# Patient Record
Sex: Female | Born: 1980 | Race: White | Hispanic: No | Marital: Single
Health system: Southern US, Community
[De-identification: ages and names within clinical notes are randomized; demographics above are authoritative.]

---

## 2001-09-08 ENCOUNTER — Emergency Department (HOSPITAL_COMMUNITY): Admission: EM | Admit: 2001-09-08 | Discharge: 2001-09-08 | Payer: Self-pay | Admitting: Emergency Medicine

## 2002-01-25 ENCOUNTER — Encounter: Payer: Self-pay | Admitting: Emergency Medicine

## 2002-01-25 ENCOUNTER — Emergency Department (HOSPITAL_COMMUNITY): Admission: EM | Admit: 2002-01-25 | Discharge: 2002-01-25 | Payer: Self-pay | Admitting: Emergency Medicine

## 2002-02-04 ENCOUNTER — Encounter: Payer: Self-pay | Admitting: *Deleted

## 2002-02-04 ENCOUNTER — Emergency Department (HOSPITAL_COMMUNITY): Admission: EM | Admit: 2002-02-04 | Discharge: 2002-02-04 | Payer: Self-pay | Admitting: *Deleted

## 2004-06-02 ENCOUNTER — Emergency Department (HOSPITAL_COMMUNITY): Admission: EM | Admit: 2004-06-02 | Discharge: 2004-06-02 | Payer: Self-pay | Admitting: Emergency Medicine

## 2004-08-09 ENCOUNTER — Emergency Department (HOSPITAL_COMMUNITY): Admission: EM | Admit: 2004-08-09 | Discharge: 2004-08-09 | Payer: Self-pay | Admitting: Emergency Medicine

## 2005-03-08 ENCOUNTER — Emergency Department (HOSPITAL_COMMUNITY): Admission: EM | Admit: 2005-03-08 | Discharge: 2005-03-09 | Payer: Self-pay | Admitting: Emergency Medicine

## 2005-05-11 ENCOUNTER — Emergency Department (HOSPITAL_COMMUNITY): Admission: EM | Admit: 2005-05-11 | Discharge: 2005-05-11 | Payer: Self-pay | Admitting: Emergency Medicine

## 2005-05-14 ENCOUNTER — Emergency Department (HOSPITAL_COMMUNITY): Admission: EM | Admit: 2005-05-14 | Discharge: 2005-05-14 | Payer: Self-pay | Admitting: Emergency Medicine

## 2006-01-24 ENCOUNTER — Inpatient Hospital Stay (HOSPITAL_COMMUNITY): Admission: EM | Admit: 2006-01-24 | Discharge: 2006-01-25 | Payer: Self-pay | Admitting: Gynecology

## 2006-01-24 ENCOUNTER — Encounter: Payer: Self-pay | Admitting: Emergency Medicine

## 2006-01-27 ENCOUNTER — Inpatient Hospital Stay (HOSPITAL_COMMUNITY): Admission: AD | Admit: 2006-01-27 | Discharge: 2006-01-27 | Payer: Self-pay | Admitting: Gynecology

## 2006-03-27 ENCOUNTER — Emergency Department (HOSPITAL_COMMUNITY): Admission: EM | Admit: 2006-03-27 | Discharge: 2006-03-27 | Payer: Self-pay | Admitting: Emergency Medicine

## 2006-05-02 ENCOUNTER — Emergency Department (HOSPITAL_COMMUNITY): Admission: EM | Admit: 2006-05-02 | Discharge: 2006-05-02 | Payer: Self-pay | Admitting: Emergency Medicine

## 2006-05-23 ENCOUNTER — Emergency Department (HOSPITAL_COMMUNITY): Admission: EM | Admit: 2006-05-23 | Discharge: 2006-05-23 | Payer: Self-pay | Admitting: Emergency Medicine

## 2006-11-02 ENCOUNTER — Emergency Department (HOSPITAL_COMMUNITY): Admission: EM | Admit: 2006-11-02 | Discharge: 2006-11-02 | Payer: Self-pay | Admitting: Emergency Medicine

## 2006-11-05 ENCOUNTER — Emergency Department (HOSPITAL_COMMUNITY): Admission: EM | Admit: 2006-11-05 | Discharge: 2006-11-05 | Payer: Self-pay | Admitting: Emergency Medicine

## 2006-11-06 ENCOUNTER — Ambulatory Visit: Payer: Self-pay | Admitting: Gynecology

## 2006-11-06 ENCOUNTER — Emergency Department (HOSPITAL_COMMUNITY): Admission: EM | Admit: 2006-11-06 | Discharge: 2006-11-06 | Payer: Self-pay | Admitting: Emergency Medicine

## 2006-11-07 ENCOUNTER — Inpatient Hospital Stay (HOSPITAL_COMMUNITY): Admission: AD | Admit: 2006-11-07 | Discharge: 2006-11-07 | Payer: Self-pay | Admitting: Family Medicine

## 2006-11-07 ENCOUNTER — Emergency Department (HOSPITAL_COMMUNITY): Admission: EM | Admit: 2006-11-07 | Discharge: 2006-11-07 | Payer: Self-pay | Admitting: Emergency Medicine

## 2006-11-07 ENCOUNTER — Encounter (INDEPENDENT_AMBULATORY_CARE_PROVIDER_SITE_OTHER): Payer: Self-pay | Admitting: Gynecology

## 2006-11-07 ENCOUNTER — Ambulatory Visit: Payer: Self-pay | Admitting: Gynecology

## 2007-07-01 ENCOUNTER — Emergency Department (HOSPITAL_COMMUNITY): Admission: EM | Admit: 2007-07-01 | Discharge: 2007-07-01 | Payer: Self-pay | Admitting: Emergency Medicine

## 2007-09-08 ENCOUNTER — Emergency Department (HOSPITAL_COMMUNITY): Admission: EM | Admit: 2007-09-08 | Discharge: 2007-09-08 | Payer: Self-pay | Admitting: Emergency Medicine

## 2007-09-13 ENCOUNTER — Emergency Department (HOSPITAL_COMMUNITY): Admission: EM | Admit: 2007-09-13 | Discharge: 2007-09-13 | Payer: Self-pay | Admitting: Emergency Medicine

## 2007-09-15 ENCOUNTER — Emergency Department (HOSPITAL_COMMUNITY): Admission: EM | Admit: 2007-09-15 | Discharge: 2007-09-15 | Payer: Self-pay | Admitting: Emergency Medicine

## 2007-11-23 ENCOUNTER — Emergency Department (HOSPITAL_BASED_OUTPATIENT_CLINIC_OR_DEPARTMENT_OTHER): Admission: EM | Admit: 2007-11-23 | Discharge: 2007-11-23 | Payer: Self-pay | Admitting: Emergency Medicine

## 2007-12-14 ENCOUNTER — Emergency Department (HOSPITAL_BASED_OUTPATIENT_CLINIC_OR_DEPARTMENT_OTHER): Admission: EM | Admit: 2007-12-14 | Discharge: 2007-12-14 | Payer: Self-pay | Admitting: Emergency Medicine

## 2007-12-16 ENCOUNTER — Emergency Department (HOSPITAL_BASED_OUTPATIENT_CLINIC_OR_DEPARTMENT_OTHER): Admission: EM | Admit: 2007-12-16 | Discharge: 2007-12-16 | Payer: Self-pay | Admitting: Emergency Medicine

## 2007-12-18 ENCOUNTER — Emergency Department (HOSPITAL_BASED_OUTPATIENT_CLINIC_OR_DEPARTMENT_OTHER): Admission: EM | Admit: 2007-12-18 | Discharge: 2007-12-18 | Payer: Self-pay | Admitting: Emergency Medicine

## 2007-12-25 ENCOUNTER — Emergency Department (HOSPITAL_BASED_OUTPATIENT_CLINIC_OR_DEPARTMENT_OTHER): Admission: EM | Admit: 2007-12-25 | Discharge: 2007-12-25 | Payer: Self-pay | Admitting: Emergency Medicine

## 2008-01-12 ENCOUNTER — Emergency Department (HOSPITAL_BASED_OUTPATIENT_CLINIC_OR_DEPARTMENT_OTHER): Admission: EM | Admit: 2008-01-12 | Discharge: 2008-01-12 | Payer: Self-pay | Admitting: Emergency Medicine

## 2008-01-12 ENCOUNTER — Emergency Department (HOSPITAL_COMMUNITY): Admission: EM | Admit: 2008-01-12 | Discharge: 2008-01-13 | Payer: Self-pay | Admitting: Emergency Medicine

## 2008-01-18 ENCOUNTER — Encounter: Payer: Self-pay | Admitting: Emergency Medicine

## 2008-01-18 ENCOUNTER — Inpatient Hospital Stay (HOSPITAL_COMMUNITY): Admission: AD | Admit: 2008-01-18 | Discharge: 2008-01-22 | Payer: Self-pay | Admitting: Internal Medicine

## 2008-01-18 ENCOUNTER — Ambulatory Visit: Payer: Self-pay | Admitting: Internal Medicine

## 2008-01-20 ENCOUNTER — Ambulatory Visit: Payer: Self-pay | Admitting: Infectious Diseases

## 2008-01-24 ENCOUNTER — Inpatient Hospital Stay (HOSPITAL_COMMUNITY): Admission: EM | Admit: 2008-01-24 | Discharge: 2008-01-30 | Payer: Self-pay | Admitting: Emergency Medicine

## 2008-01-24 ENCOUNTER — Ambulatory Visit: Payer: Self-pay | Admitting: Critical Care Medicine

## 2008-01-27 ENCOUNTER — Encounter: Payer: Self-pay | Admitting: Critical Care Medicine

## 2008-02-01 ENCOUNTER — Telehealth (INDEPENDENT_AMBULATORY_CARE_PROVIDER_SITE_OTHER): Payer: Self-pay | Admitting: *Deleted

## 2008-02-04 ENCOUNTER — Telehealth: Payer: Self-pay | Admitting: Critical Care Medicine

## 2008-02-04 ENCOUNTER — Ambulatory Visit: Payer: Self-pay | Admitting: Critical Care Medicine

## 2008-02-04 DIAGNOSIS — F319 Bipolar disorder, unspecified: Secondary | ICD-10-CM | POA: Insufficient documentation

## 2008-02-04 DIAGNOSIS — J189 Pneumonia, unspecified organism: Secondary | ICD-10-CM | POA: Insufficient documentation

## 2008-02-04 DIAGNOSIS — J45909 Unspecified asthma, uncomplicated: Secondary | ICD-10-CM | POA: Insufficient documentation

## 2008-02-04 DIAGNOSIS — F17200 Nicotine dependence, unspecified, uncomplicated: Secondary | ICD-10-CM | POA: Insufficient documentation

## 2008-02-04 DIAGNOSIS — E669 Obesity, unspecified: Secondary | ICD-10-CM | POA: Insufficient documentation

## 2008-02-05 ENCOUNTER — Emergency Department (HOSPITAL_BASED_OUTPATIENT_CLINIC_OR_DEPARTMENT_OTHER): Admission: EM | Admit: 2008-02-05 | Discharge: 2008-02-05 | Payer: Self-pay | Admitting: Emergency Medicine

## 2008-02-06 ENCOUNTER — Emergency Department (HOSPITAL_BASED_OUTPATIENT_CLINIC_OR_DEPARTMENT_OTHER): Admission: EM | Admit: 2008-02-06 | Discharge: 2008-02-06 | Payer: Self-pay | Admitting: Emergency Medicine

## 2008-02-06 ENCOUNTER — Encounter: Payer: Self-pay | Admitting: Critical Care Medicine

## 2008-02-07 ENCOUNTER — Emergency Department (HOSPITAL_BASED_OUTPATIENT_CLINIC_OR_DEPARTMENT_OTHER): Admission: EM | Admit: 2008-02-07 | Discharge: 2008-02-07 | Payer: Self-pay | Admitting: Emergency Medicine

## 2008-02-08 ENCOUNTER — Telehealth (INDEPENDENT_AMBULATORY_CARE_PROVIDER_SITE_OTHER): Payer: Self-pay | Admitting: *Deleted

## 2008-02-09 ENCOUNTER — Emergency Department (HOSPITAL_COMMUNITY): Admission: EM | Admit: 2008-02-09 | Discharge: 2008-02-09 | Payer: Self-pay | Admitting: Emergency Medicine

## 2008-02-09 ENCOUNTER — Ambulatory Visit: Payer: Self-pay | Admitting: Internal Medicine

## 2008-02-10 ENCOUNTER — Emergency Department (HOSPITAL_BASED_OUTPATIENT_CLINIC_OR_DEPARTMENT_OTHER): Admission: EM | Admit: 2008-02-10 | Discharge: 2008-02-10 | Payer: Self-pay | Admitting: Emergency Medicine

## 2008-02-18 ENCOUNTER — Emergency Department (HOSPITAL_COMMUNITY): Admission: EM | Admit: 2008-02-18 | Discharge: 2008-02-18 | Payer: Self-pay | Admitting: Emergency Medicine

## 2008-02-21 ENCOUNTER — Emergency Department (HOSPITAL_COMMUNITY): Admission: EM | Admit: 2008-02-21 | Discharge: 2008-02-21 | Payer: Self-pay | Admitting: Emergency Medicine

## 2008-02-21 ENCOUNTER — Inpatient Hospital Stay (HOSPITAL_COMMUNITY): Admission: AD | Admit: 2008-02-21 | Discharge: 2008-02-21 | Payer: Self-pay | Admitting: Obstetrics and Gynecology

## 2008-02-25 ENCOUNTER — Emergency Department (HOSPITAL_BASED_OUTPATIENT_CLINIC_OR_DEPARTMENT_OTHER): Admission: EM | Admit: 2008-02-25 | Discharge: 2008-02-25 | Payer: Self-pay | Admitting: Emergency Medicine

## 2008-03-16 ENCOUNTER — Ambulatory Visit: Payer: Self-pay | Admitting: Diagnostic Radiology

## 2008-03-16 ENCOUNTER — Emergency Department (HOSPITAL_BASED_OUTPATIENT_CLINIC_OR_DEPARTMENT_OTHER): Admission: EM | Admit: 2008-03-16 | Discharge: 2008-03-16 | Payer: Self-pay | Admitting: Emergency Medicine

## 2008-03-18 ENCOUNTER — Emergency Department: Payer: Self-pay | Admitting: Emergency Medicine

## 2008-03-23 ENCOUNTER — Emergency Department (HOSPITAL_BASED_OUTPATIENT_CLINIC_OR_DEPARTMENT_OTHER): Admission: EM | Admit: 2008-03-23 | Discharge: 2008-03-23 | Payer: Self-pay | Admitting: Emergency Medicine

## 2008-04-12 ENCOUNTER — Emergency Department (HOSPITAL_COMMUNITY): Admission: EM | Admit: 2008-04-12 | Discharge: 2008-04-12 | Payer: Self-pay | Admitting: Emergency Medicine

## 2008-04-12 ENCOUNTER — Emergency Department: Payer: Self-pay | Admitting: Internal Medicine

## 2008-04-25 ENCOUNTER — Emergency Department (HOSPITAL_COMMUNITY): Admission: EM | Admit: 2008-04-25 | Discharge: 2008-04-25 | Payer: Self-pay | Admitting: Emergency Medicine

## 2008-04-28 ENCOUNTER — Emergency Department (HOSPITAL_BASED_OUTPATIENT_CLINIC_OR_DEPARTMENT_OTHER): Admission: EM | Admit: 2008-04-28 | Discharge: 2008-04-28 | Payer: Self-pay | Admitting: Emergency Medicine

## 2008-10-24 ENCOUNTER — Ambulatory Visit: Payer: Self-pay | Admitting: Occupational Medicine

## 2008-12-04 ENCOUNTER — Emergency Department (HOSPITAL_COMMUNITY): Admission: EM | Admit: 2008-12-04 | Discharge: 2008-12-04 | Payer: Self-pay | Admitting: Emergency Medicine

## 2008-12-17 ENCOUNTER — Emergency Department (HOSPITAL_BASED_OUTPATIENT_CLINIC_OR_DEPARTMENT_OTHER): Admission: EM | Admit: 2008-12-17 | Discharge: 2008-12-17 | Payer: Self-pay | Admitting: Internal Medicine

## 2009-05-01 ENCOUNTER — Emergency Department (HOSPITAL_BASED_OUTPATIENT_CLINIC_OR_DEPARTMENT_OTHER): Admission: EM | Admit: 2009-05-01 | Discharge: 2009-05-01 | Payer: Self-pay | Admitting: Emergency Medicine

## 2010-01-09 ENCOUNTER — Emergency Department (HOSPITAL_COMMUNITY): Admission: EM | Admit: 2010-01-09 | Discharge: 2010-01-09 | Payer: Self-pay | Admitting: Emergency Medicine

## 2010-06-27 IMAGING — CT CT ABDOMEN W/O CM
2 of 3 series · 17 of 46 positions shown, 19 images · non-contrast
Comparison: 12/14/2007

CT ABDOMEN

CLINICAL DATA: right lower quadrant pain

CT ABDOMEN AND PELVIS WITHOUT CONTRAST
TECHNIQUE: Multidetector CT imaging of the abdomen and pelvis was
performed following the standard
protocol without intravenous contrast.

[Series 2: renal stone > 200 lbs 5.0 b31f · axial · 0.85mm/px · z∈[-458,-68]mm · 14 of 90 slices shown, 16 images]
[im 6/90  soft-tissue]
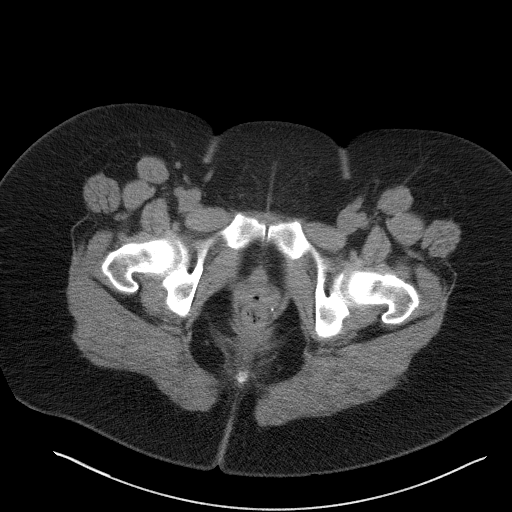
[im 6/90  bone]
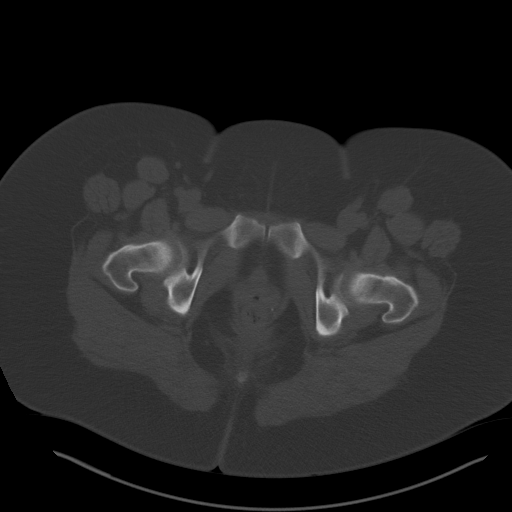
[im 12/90  soft-tissue]
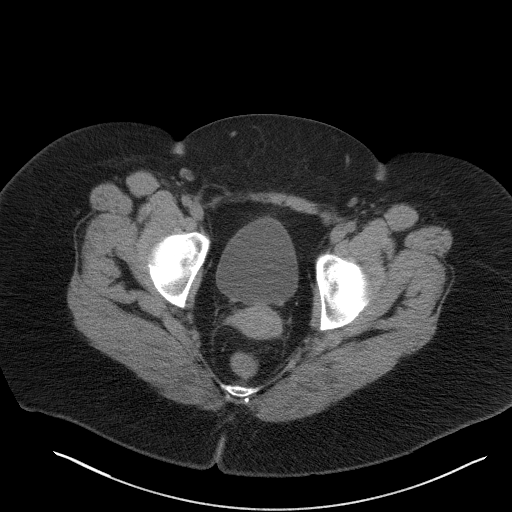
[im 18/90  soft-tissue]
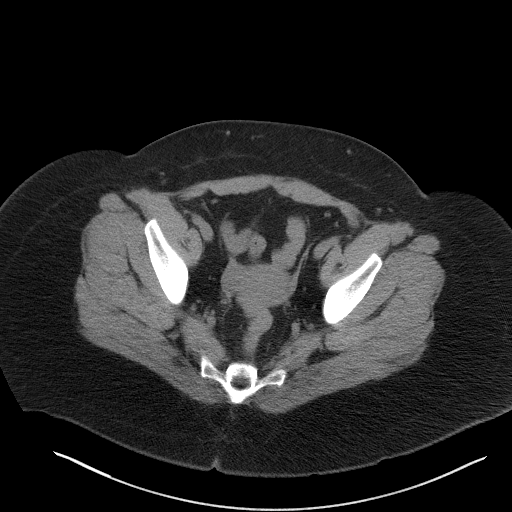
[im 23/90  soft-tissue]
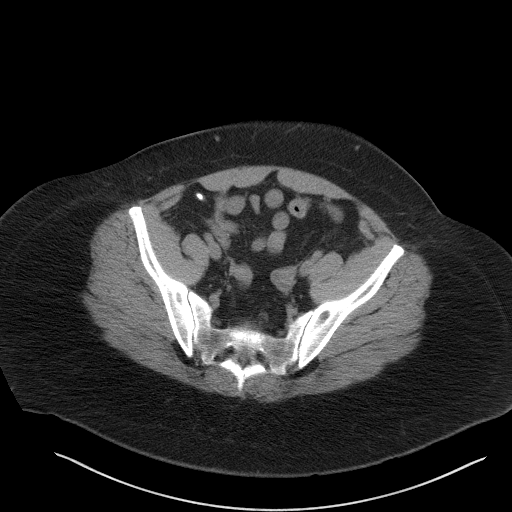
[im 29/90  soft-tissue]
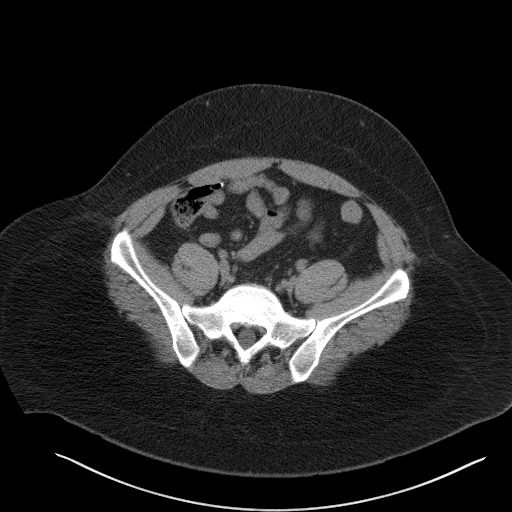
[im 35/90  soft-tissue]
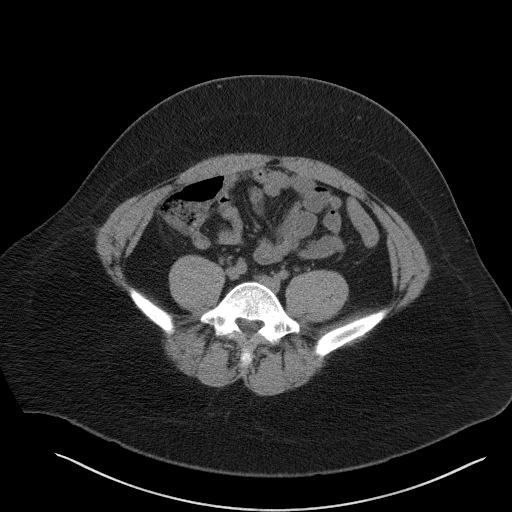
[im 41/90  soft-tissue]
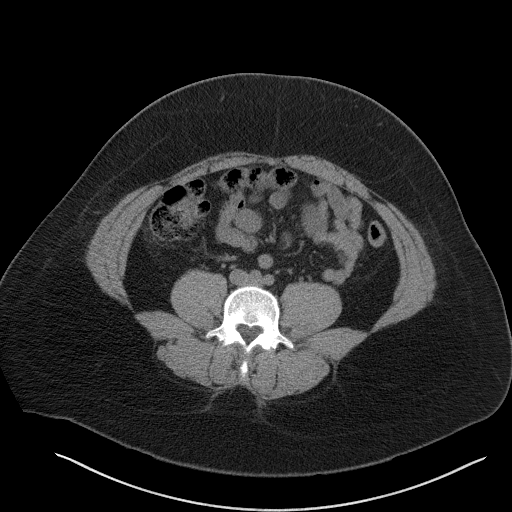
[im 49/90  soft-tissue]
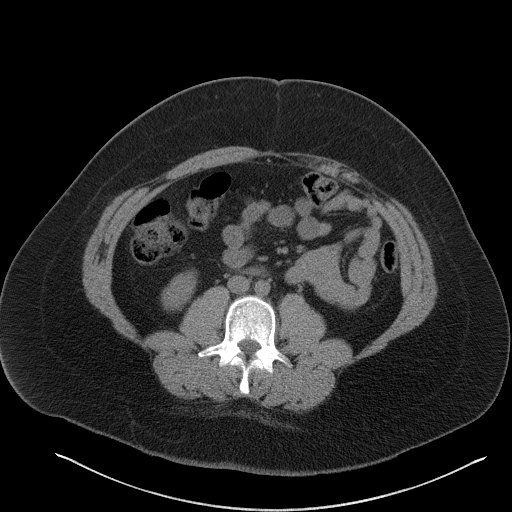
[im 55/90  soft-tissue]
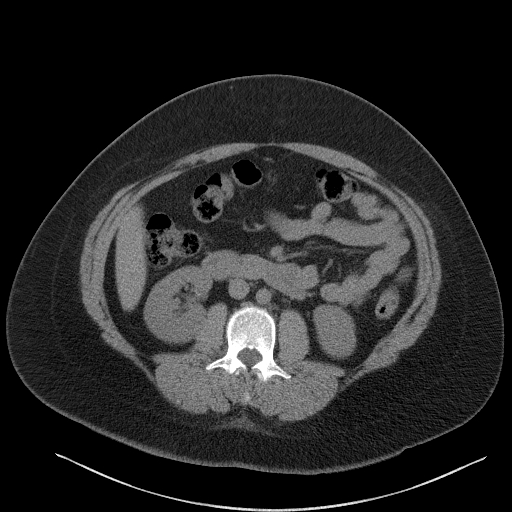
[im 55/90  bone]
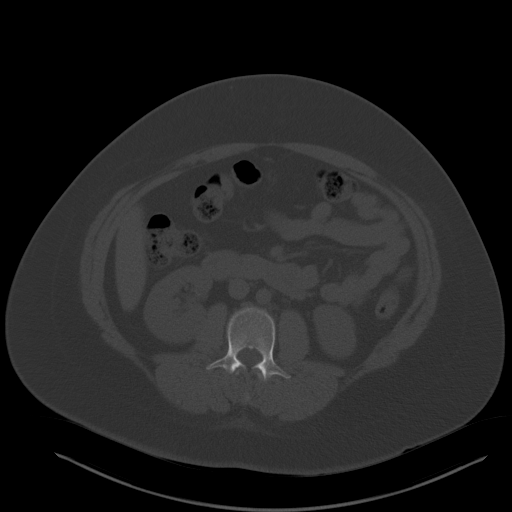
[im 61/90  soft-tissue]
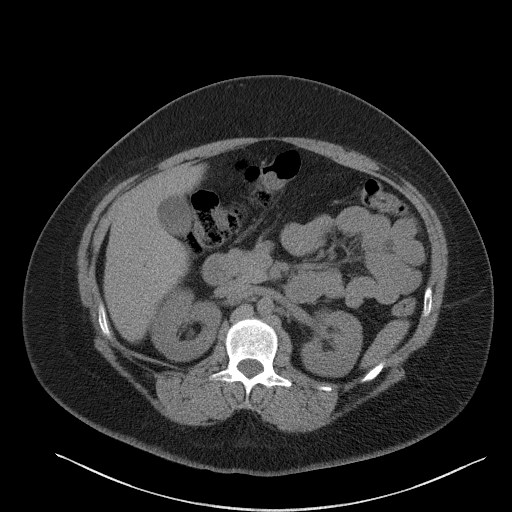
[im 67/90  soft-tissue]
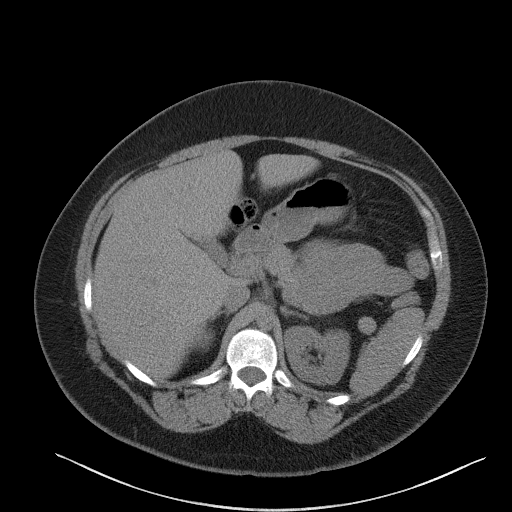
[im 72/90  soft-tissue]
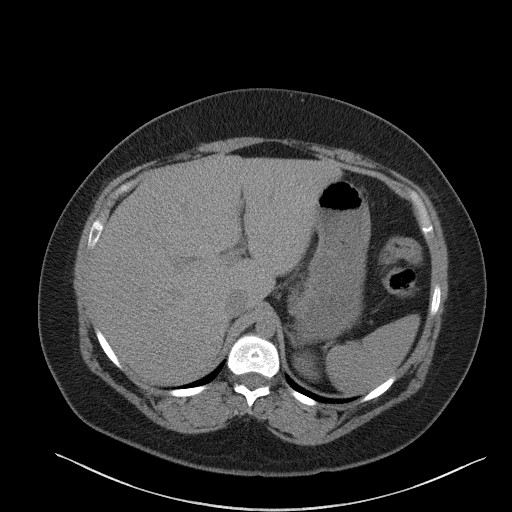
[im 78/90  soft-tissue]
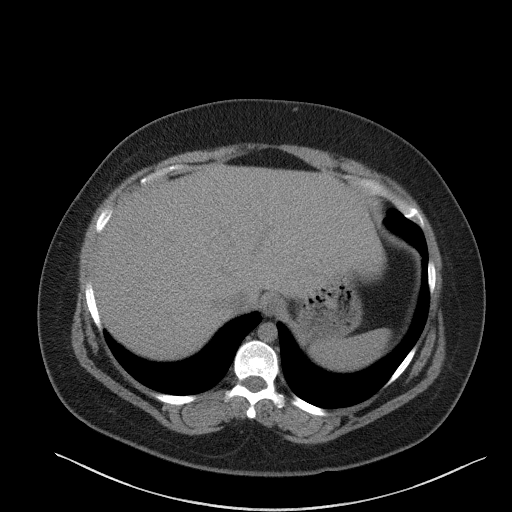
[im 84/90  soft-tissue]
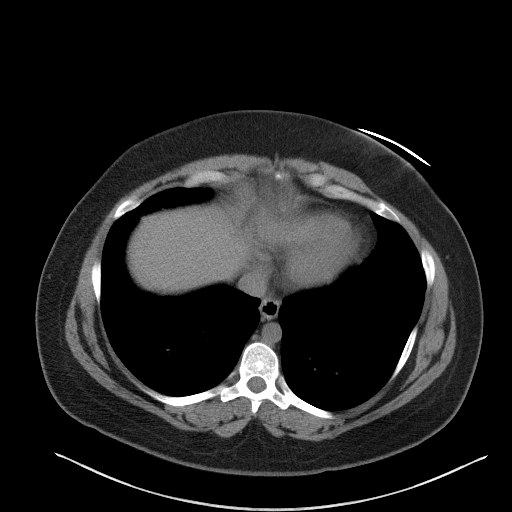

[Series 3: renal stone 2.0 coronal · coronal · 0.84mm/px · 3 of 158 slices shown]
[im 53/158  soft-tissue]
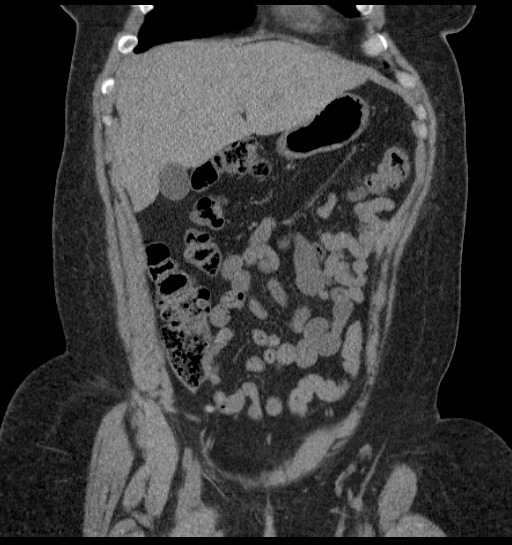
[im 70/158  soft-tissue]
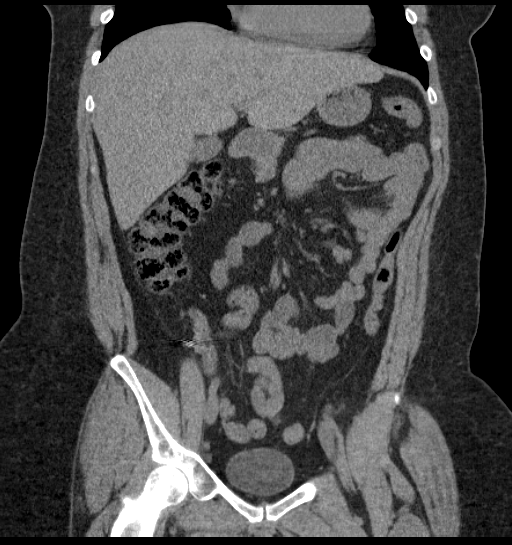
[im 88/158  soft-tissue]
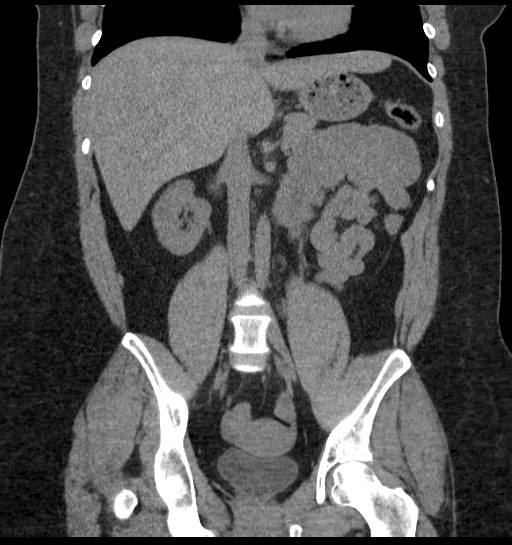

[17 of 46 positions shown; findings below may reference images not displayed]

FINDINGS: No renal or proximal ureteral stones.  No
hydronephrosis.  Solid organs have an unremarkable unenhanced
appearance.  No free fluid, free air, or adenopathy.  Bowel and
gallbladder grossly unremarkable.

Lung bases are clear.  No effusions.  Heart is normal size.
IMPRESSION: No acute findings in the abdomen on this unenhanced study.

CT PELVIS
FINDINGS: Appendix is normal, partially filled with oral contrast
material likely from recent contrasted CT.  Rounded metallic
structure noted within the cecum of unknown etiology.  This was not
present on prior study, and may be related to ingested material.
Scattered sigmoid diverticula.  No evidence of active
diverticulitis.  No free fluid, free air, or adenopathy.  Pelvic
small bowel grossly unremarkable without contrast.

No acute bony abnormality.
IMPRESSION: No significant acute finding or inflammatory process in the pelvis.

## 2010-06-28 LAB — DIFFERENTIAL
Basophils Absolute: 0 10*3/uL (ref 0.0–0.1)
Lymphocytes Relative: 14 % (ref 12–46)
Lymphs Abs: 2.1 10*3/uL (ref 0.7–4.0)
Neutro Abs: 12.1 10*3/uL — ABNORMAL HIGH (ref 1.7–7.7)
Neutrophils Relative %: 79 % — ABNORMAL HIGH (ref 43–77)

## 2010-06-28 LAB — CBC
Platelets: 296 10*3/uL (ref 150–400)
RBC: 4.23 MIL/uL (ref 3.87–5.11)
RDW: 14.2 % (ref 11.5–15.5)
WBC: 15.4 10*3/uL — ABNORMAL HIGH (ref 4.0–10.5)

## 2010-06-28 LAB — BASIC METABOLIC PANEL
Calcium: 9.1 mg/dL (ref 8.4–10.5)
Chloride: 105 mEq/L (ref 96–112)
Creatinine, Ser: 0.84 mg/dL (ref 0.4–1.2)
GFR calc Af Amer: >60 mL/min (ref >60)
GFR calc non Af Amer: >60 mL/min (ref >60)

## 2010-07-01 LAB — COMPREHENSIVE METABOLIC PANEL
ALT: 30 U/L (ref 0–35)
AST: 22 U/L (ref 0–37)
Albumin: 4.4 g/dL (ref 3.5–5.2)
CO2: 25 mEq/L (ref 19–32)
Calcium: 9 mg/dL (ref 8.4–10.5)
Chloride: 105 mEq/L (ref 96–112)
GFR calc Af Amer: >60 mL/min (ref >60)
GFR calc non Af Amer: >60 mL/min (ref >60)
Sodium: 138 mEq/L (ref 135–145)
Total Bilirubin: 0.4 mg/dL (ref 0.3–1.2)

## 2010-07-21 LAB — URINALYSIS, ROUTINE W REFLEX MICROSCOPIC
Bilirubin Urine: NEGATIVE
Hgb urine dipstick: NEGATIVE
Ketones, ur: NEGATIVE mg/dL
Nitrite: NEGATIVE
Specific Gravity, Urine: 1.029 (ref 1.005–1.030)
pH: 6 (ref 5.0–8.0)

## 2010-07-21 LAB — COMPREHENSIVE METABOLIC PANEL
Alkaline Phosphatase: 56 U/L (ref 39–117)
BUN: 13 mg/dL (ref 6–23)
CO2: 23 mEq/L (ref 19–32)
Chloride: 108 mEq/L (ref 96–112)
Creatinine, Ser: 1.04 mg/dL (ref 0.4–1.2)
GFR calc non Af Amer: >60 mL/min (ref >60)
Glucose, Bld: 95 mg/dL (ref 70–99)
Potassium: 3.6 mEq/L (ref 3.5–5.1)
Total Bilirubin: 0.6 mg/dL (ref 0.3–1.2)

## 2010-07-21 LAB — AMMONIA: Ammonia: 24 umol/L (ref 11–35)

## 2010-07-21 LAB — CBC
HCT: 37 % (ref 36.0–46.0)
Hemoglobin: 12.7 g/dL (ref 12.0–15.0)
MCV: 92.2 fL (ref 78.0–100.0)
Platelets: 310 10*3/uL (ref 150–400)
WBC: 11.6 10*3/uL — ABNORMAL HIGH (ref 4.0–10.5)

## 2010-07-21 LAB — DIFFERENTIAL
Basophils Absolute: 0.1 10*3/uL (ref 0.0–0.1)
Eosinophils Absolute: 0.3 10*3/uL (ref 0.0–0.7)
Eosinophils Relative: 3 % (ref 0–5)
Lymphocytes Relative: 26 % (ref 12–46)
Monocytes Absolute: 0.7 10*3/uL (ref 0.1–1.0)

## 2010-07-21 LAB — ETHANOL: Alcohol, Ethyl (B): <5 mg/dL (ref 0–10)

## 2010-07-21 LAB — PROTIME-INR: Prothrombin Time: 12.6 seconds (ref 11.6–15.2)

## 2010-07-21 LAB — LITHIUM LEVEL: Lithium Lvl: 0.59 mEq/L — ABNORMAL LOW (ref 0.80–1.40)

## 2010-07-30 LAB — URINALYSIS, ROUTINE W REFLEX MICROSCOPIC
Bilirubin Urine: NEGATIVE
Hgb urine dipstick: NEGATIVE
Nitrite: NEGATIVE
Specific Gravity, Urine: 1.008 (ref 1.005–1.030)
Urobilinogen, UA: 0.2 mg/dL (ref 0.0–1.0)
pH: 7.5 (ref 5.0–8.0)

## 2010-07-30 LAB — COMPREHENSIVE METABOLIC PANEL WITH GFR
ALT: 13 U/L (ref 0–35)
AST: 13 U/L (ref 0–37)
Albumin: 4.2 g/dL (ref 3.5–5.2)
Alkaline Phosphatase: 70 U/L (ref 39–117)
BUN: 8 mg/dL (ref 6–23)
CO2: 29 meq/L (ref 19–32)
Calcium: 9.8 mg/dL (ref 8.4–10.5)
Chloride: 102 meq/L (ref 96–112)
Creatinine, Ser: 0.97 mg/dL (ref 0.4–1.2)
GFR calc non Af Amer: >60 mL/min
Glucose, Bld: 106 mg/dL — ABNORMAL HIGH (ref 70–99)
Potassium: 4.3 meq/L (ref 3.5–5.1)
Sodium: 138 meq/L (ref 135–145)
Total Bilirubin: 0.6 mg/dL (ref 0.3–1.2)
Total Protein: 6.6 g/dL (ref 6.0–8.3)

## 2010-07-30 LAB — CBC
Hemoglobin: 13.7 g/dL (ref 12.0–15.0)
RBC: 4.25 MIL/uL (ref 3.87–5.11)
WBC: 6.5 10*3/uL (ref 4.0–10.5)

## 2010-07-30 LAB — DIFFERENTIAL
Basophils Absolute: 0 K/uL (ref 0.0–0.1)
Basophils Relative: 1 % (ref 0–1)
Eosinophils Absolute: 0.2 K/uL (ref 0.0–0.7)
Eosinophils Relative: 4 % (ref 0–5)
Lymphocytes Relative: 21 % (ref 12–46)
Lymphs Abs: 1.4 K/uL (ref 0.7–4.0)
Monocytes Absolute: 0.5 K/uL (ref 0.1–1.0)
Monocytes Relative: 8 % (ref 3–12)
Neutro Abs: 4.3 K/uL (ref 1.7–7.7)
Neutrophils Relative %: 67 % (ref 43–77)

## 2010-07-30 LAB — LIPASE, BLOOD: Lipase: 15 U/L (ref 11–59)

## 2010-08-28 NOTE — Discharge Summary (Signed)
NAMESHAMERE, DILWORTH       ACCOUNT NO.:  [id number]   MEDICAL RECORD NO.:  [id number]          PATIENT TYPE:  INP   LOCATION:  1441                         FACILITY:  [facility]   PHYSICIAN:  Beckey Rutter, MD  DATE OF BIRTH:  1980/06/01   DATE OF ADMISSION:  01/23/2008  DATE OF DISCHARGE:  01/30/2008                               DISCHARGE SUMMARY   PRIMARY CARE PHYSICIAN:  Unassigned.   HOSPITAL COURSE:  Shortness of breath with bilateral infiltrates.  Atypical pneumonia.  The patient was seen by pulmonary services for  consultation and underwent fiberoptic bronchoscopy.  The patient was  continued on antibiotic during the hospital course and on steroid,  antibiotic Avelox and on steroid as well.  The patient currently stable  for discharge.  She was advised to follow up with Dr. Shan Levans for  the result of the BAL.   HOSPITAL CONSULTATION:  Pulmonary consultation provided kindly by Dr.  Delford Field.   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  Recommendation is to  follow up pathology and microbiology.  Hospital imaging included an x-  ray done on January 24, 2008, showing improving bilateral  bronchioalveolar ground glass likely representing improving pneumonia.  Lab test today showing respiratory culture is negative up-to-date.  Blood culture is negative up-to-date.  Fungal antibodies negative.  Legionella profile is negative.   DISCHARGE DIAGNOSES:  1. Bilateral pneumonia/bronchoalveolitis after the FOB (fiberoptic      bronchoscopy).  2. History of bronchial asthma.  3. Bipolar disorder.  4. Obesity.  5. Tobacco abuse.   DISCHARGE MEDICATIONS:  1. Nicotine patch 21 mg p.o. daily.  2. Doxycycline 100 mg p.o. for 7 days.  3. Prednisone tapering dose start with 30 mg for 4 days, 20 mg for 4      days, 10 mg for 4 days and then stop.  4. Vicodin 5/325 p.o. q.6 hours p.r.n.  A  prescription was written for      15.  5. Lithium 300 mg p.o. daily.  6. Lithium 600 mg at bedtime.  7. Geodon 120 mg twice a day.  8. Prozac 20 mg 2 tablets daily.  9. Klonopin 1 mg at bedtime.  10.Trazodone 100 mg at bedtime.  11.Prilosec 40 mg.  12.Vicodin 5/325 mg 2 tablets p.o. q.6 hours p.r.n. #15.   DISCHARGE PLAN:  The patient is stable for discharge today.  She will  follow up with [facility] in 1-2 weeks.  The patient is aware and  agreeable to the discharge plan.  Notice, prescription for Vicodin 5/325  mg 2 tablets p.o. q.6 hours p.r.n. was prescribed for #15.      Beckey Rutter, MD  Electronically Signed     EME/MEDQ  D:  01/30/2008  T:  01/30/2008  Job:  [id number]

## 2010-08-28 NOTE — Consult Note (Signed)
NAMEVICKEE, MORMINO       ACCOUNT NO.:  [id number]   MEDICAL RECORD NO.:  [id number]          PATIENT TYPE:  INP   LOCATION:  1441                         FACILITY:  [facility]   PHYSICIAN:  Charlcie Cradle. Delford Field, MD, FCCPDATE OF BIRTH:  01-[birth year]-80   DATE OF CONSULTATION:  01/26/2008  DATE OF DISCHARGE:                                 CONSULTATION   INDICATIONS:  Bilateral infiltrates, bronchiectasis, bronchitis.  Evaluate for pneumonia.  This is a 30 year old white female who has had  a chronic cough for 2 months, was admitted to [facility] between October  5 and October 9 and discharged.  At that point she had shortness of  breath, nausea, vomiting.  These symptoms have now continued.  She was  tested H1N1 negative.  She has history of bipolar disorder, asthma,  restless leg, endometriosis, polycystic ovary disease and obesity.  She  has had bilateral patchy ground-glass changes on CT scan.  She was  recommended take Avelox and get CT scan follow-up as an outpatient.  No  bronchoscopy was performed.  The patient continues to have a symptom  complex that is persisting and she is referred now for further  evaluation.  She was readmitted to [facility] and on this  basis repeat evaluation is requested.  The system does show a CT scan of  the chest in February 2008 showing a mosaic attenuation lung pattern  that could be compatible with infiltrative lung disease so this is a  chronic process and not an acute process that we are discerning now.  Current CT scan is reading minimal mediastinal adenopathy, bilateral  ground-glass changes that appear to be peribronchovascular in  distribution, slightly improved from January 18, 2008.  The upper abdomen  shows no stones and no worrisome lytic or sclerotic lesions.  There had  been a preexisting scan done of the chest on January 18, 2008 and within  6 days it was repeated showing some improvement.  Based on this, a  bronchoscopy was not  elected.  The patient was sent home.  The patient  now returns for further evaluation and treatments.   PAST MEDICAL HISTORY:  As noted above.   SURGICAL HISTORY:  None.   MEDICATIONS ON ADMISSION:  1. Avelox.  2. Lithium.  3. Geodon.  4. Prozac.  5. Klonopin.  6. Trazodone.  7. Tylenol.   ALLERGIES:  DEMEROL, TORADOL.   SOCIAL HISTORY:  Unemployed, smokes a pack and a half a day, is a  nondrinker, no illicit drug use.   FAMILY HISTORY:  Noncontributory except for bipolar disorder.   PHYSICAL EXAM:  VITAL SIGNS:  Temperature 98, blood pressure 126/70,  pulse 120, saturations 96% on 2 liters, respirations in the 20s. CHEST:  Showed prominence pseudowheeze, but no peripheral wheezing, rales or  rhonchi.  CARDIAC:  Exam showed a regular, rate and rhythm without S3, normal S1,  S2.  ABDOMEN:  Soft, nontender.  EXTREMITIES:  No edema, clubbing or venous disease.  SKIN:  Clear.  NEUROLOGIC:  Exam was intact.  HEENT:  Exam showed no jugular venous distension, lymphadenopathy.  Oropharynx clear.  NECK:  Supple.  CT scan chest was reported and is noted as above.   OTHER LAB DATA:  C4, C3 levels were normal.  ANA is negative.  Sodium  141, potassium 3.6, chloride 110, CO2 24, BUN 8, creatinine 0.9.  White  count 13,000, hemoglobin 11.3.  HIV was nonreactive.  TSH 9.5284.  C-  reactive protein normal.  Blood cultures are negative.  Aspergillus  antibody is negative.  Sed rate was slightly elevated at 32.  Legionella  urine antigen was negative.  H1N1, PCR was negative.  Rheumatoid factor  was less than 20.  D-dimer was less than 0.22.   IMPRESSION:  Bilateral chronic mosaic pattern of ground glass changes,  rule out chronic alveolitis, rule out bronchiolitis obliterans,  organized pneumonia, rule out interstitial disease.  Also rule out vocal  cord dysfunction syndrome.   RECOMMENDATIONS:  Pursue bronchoscopy.  Once results of this are  available, further recommendations  would follow.      Charlcie Cradle Delford Field, MD, [facility]  Electronically Signed     PEW/MEDQ  D:  01/26/2008  T:  01/27/2008  Job:  [id number]   cc:   [facility]   ? [place]  Marcy [place], [place]

## 2010-08-28 NOTE — Discharge Summary (Signed)
[name], [name]       ACCOUNT NO.:  [id number]   MEDICAL RECORD NO.:  [id number]          PATIENT TYPE:  INP   LOCATION:  2041                         FACILITY:  [facility]   PHYSICIAN:  [name], MDDATE OF BIRTH:  01/09/1981   DATE OF ADMISSION:  01/18/2008  DATE OF DISCHARGE:  01/22/2008                               DISCHARGE SUMMARY   COURSE IN THE HOSPITAL:  A 30 year old female with a history of bipolar  disorder presented with flu-like symptoms and shortness of breath along  with some chest pain with nonproductive cough.  The patient on admission  had a CAT scan of the chest which showed features of bibasilar ground-  glass opacities compatible with pneumonitis or vasculitis.  The patient  admitted to the medical floor and was started on empiric antibiotics and  Infectious Disease consult was obtained.  As per Infectious Disease, a  Pulmonary consult was recommended and Pulmonology was consulted.  At  this time, Pulmonary feels that the patient's  symptoms have been  improving, to be discharged home on antireflux medications, continue  antibiotics, and will need a repeat CT of the chest in 3-4 weeks and if  at that time, the patient has persistent infiltrates, will need VATS or  bronchoscopy and per Infectious Disease, the patient's symptoms have  largely improved to be discharged on Avelox for 3 more days.  I did  advice the patient that not to take antibiotics, if planning pregnancy  and the patient also was advised that she will need a definite followup  with her primary care physician who she says she follows with Dr.  Luvenia White in [place] at [facility] and she has confirmed  that she will definitely follow with doctor within a weeks' time.   PROCEDURES DONE DURING THIS STAY:  CT angio of the chest showed negative  for pulmonary embolism, ground-glass opacities in the lungs bilaterally  have progressed slightly in the interval.  It is  likely due to  progressive pneumonitis or vasculitis.  Chest x-ray done showed patchy  atelectasis and infiltrate without consolidation is evident in the right  lung base, in the left perihilar region.  The infiltrated densities are  compatible with ground-glass infiltrates seen on the CT exam.   FINAL DIAGNOSES:  1. Pneumonitis.  2. Bipolar disorder.  3. History of cigarette smoking.   DISCHARGE MEDICATIONS:  1. Avelox 400 mg p.o. daily for 3 more days.  2. Lithium 300 mg p.o. daily in morning and 600 mg p.o. at bedtime.  3. Geodon 120 mg p.o. twice daily.  4. Prozac 20 mg 2 tablets p.o. daily.  5. Imodium as needed p.r.n.  6. Klonopin 1 mg p.o. twice daily.  7. Trazodone 100 mg p.o. at bedtime.  8. Percocet 5/325 mg p.o. every 12 hours p.r.n.  9. Prilosec 40 mg p.o. daily.   PLAN:  The patient advised to follow up with the primary care physician  within a weeks' time to repeat CT chest within 3-4 weeks.  If there is  persistent infiltrate, the patient will need at that time VATS or  bronchoscopy.  The patient strongly advised  to quit smoking.      Betty Clos, MD  Electronically Signed     ANK/MEDQ  D:  01/22/2008  T:  01/23/2008  Job:  [id number]

## 2010-08-28 NOTE — Op Note (Signed)
[name], [name]       ACCOUNT NO.:  [id number]   MEDICAL RECORD NO.:  [id number]          PATIENT TYPE:  INP   LOCATION:  1441                         [facility]ACILITY:  [facility]   PHYSICIAN:  Charlcie Cradle. Delford Field, MD, FCCPDATE OF BIRTH:  [birth year]79   DATE OF PROCEDURE:  01/27/2008  DATE OF DISCHARGE:                               OPERATIVE REPORT   INDICATIONS FOR PROCEDURE:  Bilateral infiltrates, evaluate for cause.   SURGEON:  Charlcie Cradle. Delford Field, MD, FCCP.   ANESTHESIA:  1% Xylocaine local.   PREMEDICATION:  Fentanyl 30 mcg, Versed 5 mg IV push.   PROCEDURE:  The Pentax video bronchoscope introduced through the left  naris.  The upper airways were visualized and were unremarkable.  The  entire tracheobronchial tree was visualized and revealed diffuse  tracheobronchitis but no endobronchial lesions were seen.  Attention was  then paid to the right lower lobe.  Transbronchial biopsies x5 were  obtained.  Bronchial washings were obtained.   COMPLICATIONS:  None.   IMPRESSION:  Tracheobronchitis with vocal cord dysfunction syndrome,  evaluate for cause.   RECOMMENDATIONS:  Follow up pathology and microbiology.      Charlcie Cradle Delford Field, MD, [facility]  Electronically Signed     PEW/MEDQ  D:  01/27/2008  T:  01/27/2008  Job:  [id number]

## 2010-08-28 NOTE — H&P (Signed)
[name], [name] NO.:  [id number]   MEDICAL RECORD NO.:  [id number]          PATIENT TYPE:  INP   LOCATION:  2105                         FACILITY:  [facility]   PHYSICIAN:  Vania Rea, M.D. DATE OF BIRTH:  [birth year]   DATE OF ADMISSION:  01/18/2008  DATE OF DISCHARGE:                              HISTORY & PHYSICAL   PRIMARY CARE PHYSICIAN:  Dr. Luvenia Starch in [place] at the [facility].   PSYCHIATRIST:  Dr. Charletta Cousin at the [facility].   CHIEF COMPLAINT:  Flu-like symptoms for 35-month.   HISTORY OF PRESENT ILLNESS:  This is a 30 year old somewhat obese  Caucasian lady with a history of bipolar disorder, who reports symptoms  which she had described as a flu-like illness for the past 65-month.  The  patient describes lethargy, anorexia, fever, nausea, vomiting, diarrhea,  abdominal pain and sore throat persisting for 32-month.  Because of the  persistent nonproductive cough, the patient says she now has severe  chest pains with coughing.  The patient says she has a thermometer at  home, takes her temperature every day and it is always exactly 101.2.  Initially when going through her medical record, the patient denied ever  using Tylenol; however, when asked if she takes Tylenol for the fever,  she says she takes Tylenol four times daily.  The patient says she  vomits about four times per day and it is yellow and bitter.  She has  about four mucusy stools every day, not very much, but the abdominal  pain appears to be independent of the stool frequency and is colicky.  She also notes a throbbing headache at the top of her head.  She  describes also urinary frequency without dysuria.   The patient is in fact a transfer from the [facility]  emergency room.  The accompanying documentation indicates that the  patient has been visiting for the past 4 days, with a history of flu-  like symptoms, and she was eventually  transferred for follow-up.  A  chest x-ray done showed no acute infiltrates, but did show some  increased interstitial markings.  Her D-dimer was undetectable,  nevertheless, a CT angiogram was done and this showed no acute  infiltrates, but did show what was described as increased in bibasilar  ground glass opacities, compatible with pneumonitis or vasculitis.   The patient denies any sick contacts.  Her boyfriend who she lives with  has no flu-like symptoms, no nausea, vomiting or diarrhea.  She is  currently unemployed.  Patient denies alcohol use or illicit drug use,  but does smoke half a pack of cigarettes per day.   PAST MEDICAL HISTORY:  1. Bipolar disorder.  2. Polycystic ovarian endometriosis.   MEDICATIONS:  1. Lithium 300 mg each morning and 600 mg at bedtime.  2. Geodon 120 mg twice daily.  3. Prozac 40 mg daily.  4. Klonopin 1 mg twice daily.  5. Trazodone 100 mg at bedtime.  6. Tylenol p.r.n.  7. Imodium p.r.n.   ALLERGIES:  TORADOL, DEMEROL AND OTHER NSAIDS ALL CAUSE HIVES.   SOCIAL HISTORY:  Significant  for half-a-pack per day of tobacco use.  Denies any other as noted above.   FAMILY HISTORY:  Significant for mother with bipolar disorder, otherwise  unremarkable.   REVIEW OF SYSTEMS:  Other than noted above, significant only for  persistent sweating and a feeling of being hot all the time.  She is now  on Depo-Provera monthly, which makes her periods regular, last menstrual  period, December 15, 2007, for 3 days.  She no longer has dysmenorrhea.  The patient complains of a rash on upper abdomen for the duration of her  sickness.   PHYSICAL EXAMINATION:  GENERAL:  Obese young Caucasian lady lying flat  on the stretcher, breathing rapidly, but keeps her mouth closed.  VITAL SIGNS:  Temperature is 98.4, pulse 120, respiration 24, blood  pressure 115/60.  Saturating at 97% on 2 liters.  The records from [facility] facility shows that over the past 8 hours,  her highest temperature  has been 98.5.  Her blood pressures have been normal.  Her respiratory  rate has been elevated as high as 32.  Her pulse was been elevated with  a high of 24.  Her oxygen saturation was 96% on room air.  HEENT:  Her pupils are round, equal and reactive.  Her mucous membranes  are pink and anicteric.  She is mildly dehydrated.  NECK:  She has no cervical lymphadenopathy or thyromegaly.  She does  have a thick neck.  CHEST:  She has coarse breath sounds bilaterally.  It is difficult to  assess because there appears to have a lot of upper respiratory  transmitted sounds.  CARDIOVASCULAR:  She has distant heart sounds, tachycardiac.  ABDOMEN:  Obese, soft and nontender.  EXTREMITIES:  Without edema.  She has 3+ pulses bilaterally.  She has no  bony joint deformity and no wasting of the muscles.  SKIN:  She has rosy pink ovoid plaques on her arms and her neck which  vary in size from 0.5-1 cm.  She has similar macules on her upper  abdomen, which are more beige colored.  CENTRAL NERVOUS SYSTEM:  Cranial nerves II-XII are grossly intact.  She  has no focal neurologic deficit.   LABORATORY DATA:  Her CBC is remarkable for a white count of 8.8,  hemoglobin 13, MCV 96, platelets 394.  She has 89% neutrophils.  Absolute granulocyte count of 7.9.  Sodium is 142, potassium 4.2,  chloride 102, CO2 of 26, BUN 8, creatinine 0.8, calcium 9.4.  Her  glucose was 162.  Urinalysis showed a specific gravity greater than  1.046, 15 ketones, 100 protein, otherwise unremarkable.  Salicylate and  Tylenol levels were undetectable.  Chest x-ray and CT angiogram as noted  above.   ASSESSMENT:  1. Persistent cough associated with respiratory lung infiltrates of      unclear etiology.  Differential includes Bordetella pertussis.  It      includes some type of viral syndrome and includes as x-ray suggest      pneumonitis.  2. The nausea, vomiting and diarrhea are possibly of part of  the      syndrome and possibly a reaction to the persistent coughing.  3. The associated skin rash suggests a viral etiology.  She does not      have any laboratory data, however, to support this infection.  Her      neutrophil count is elevated; however, we note she did receive a      dose of dexamethasone from  the emergency room.  4. The patient's bipolar disorder does not appear to be impacting the      patient's symptoms.  However, she does not appear to have some real      disease.   PLAN:  1. Will start this lady on Zithromax for presumed pertussis and will      get the assistance of the infectious disease team to assist with      investigation and management.  2. Will consider rheumatology consult if we feel there may be evidence      of vasculitis.  3. Will check her lithium level and continue her bipolar medications.  4. We think it is very unlikely that this lady is infectious.  She      does describe a persistent fever, but as yet, we have seen no      evidence of this.  Other plans as per orders.      Vania Rea, M.D.  Electronically Signed     LC/MEDQ  D:  01/18/2008  T:  01/18/2008  Job:  [id number]   cc:   Luvenia Starch, MD  Juluis Mire, MD

## 2010-08-28 NOTE — Group Therapy Note (Signed)
NAMEMarland White  GHALIA, REICKS NO.:  [id number]   MEDICAL RECORD NO.:  [id number]          PATIENT TYPE:  WOC   LOCATION:  [facility]                   FACILITY:  [facility]   PHYSICIAN:  Ginger Carne, MD DATE OF BIRTH:  12-Oct-1980   DATE OF SERVICE:  11/07/2006                                  CLINIC NOTE   The patient presents today following an emergency room visit at [facility] the other day because of apparently chronic pelvic pain since 2005.  She states she has never had a laparoscopy.  However, in the notes from  the resident Dr. Sylvan Cheese, who saw the patient on November 06, 2006, the  patient's that she had laparoscopy in 2005.  She claims that she has a  hearing impairment but does understand and verbalizes understanding when  one speaks to the patient directly.  Panel pelvic ultrasound and  laboratory work were within normal limits within the past week.  The  patient states that her pain is primarily with her menses, but this past  menses the pain has persisted.  She had denied medical management and  wishes a definitive diagnosis.  There are no musculoskeletal,  genitourinary, or gastrointestinal sources for her discomfort.   The patient is a nulligravida, has had knee surgery and a laparoscopy in  the past.  She smokes a half-pack of cigarettes daily.   SALIENT PHYSICAL FINDINGS:  Weight 220 pounds, height 5 feet 2 inches,  blood pressure 106/70, pulse 106 and regular.  ABDOMEN:  Soft without gross hepatosplenomegaly.  PELVIC EXAM:  External genitalia, vulva, and vagina normal.  Cervix  smooth without erosions or lesions.  There is no discomfort on cervical  motion or uterine motion maneuvers, and both adnexa are nontender and  nonpalpable masses.   IMPRESSION:  Is chronic pelvic pain, unknown etiology.  Pap smear was  obtained including gonorrhea and chlamydia cultures.  I have scheduled  her for another laparoscopy to evaluate her need for further  management  based on said diagnosis.  Ashby Dawes of said procedure discussed with the  patient.  I, at this time, and not going to prescribe any narcotic  medication.  Apparently the patient states that she received 20 Percocet  the other day in the [facility] emergency department.  Also there is a  concern by Dr. Yetta Barre, when she saw the patient yesterday, there is a  discrepancy between what the patient states she received and the number  of pills that were prescribed for Percocet.           ______________________________  Ginger Carne, MD     SHB/MEDQ  D:  11/07/2006  T:  11/07/2006  Job:  [id number]

## 2010-08-28 NOTE — H&P (Signed)
NAMELOYAL, RUDY NO.:  [id number]   MEDICAL RECORD NO.:  [id number]          PATIENT TYPE:  INP   LOCATION:  1441                         FACILITY:  [facility]   PHYSICIAN:  Della Goo, M.D. DATE OF BIRTH:  08/26/1980   DATE OF ADMISSION:  01/23/2008  DATE OF DISCHARGE:                              HISTORY & PHYSICAL   This is a readmission, the patient was recently admitted to [facility] October 5,2009 and discharged on January 22, 2008.   PRIMARY CARE PHYSICIAN:  Dr. Luvenia Starch in [place].   CHIEF COMPLAINTS:  Shortness of breath, nausea and vomiting.   HISTORY OF PRESENT ILLNESS:  This is a 30 year old female presenting to  the Emergency Department at [facility] in the [facility]  system secondary to continued shortness of breath along with nausea and  vomiting, coughing and wheezing since her discharge 1 day ago.  The  patient reports not being able to keep down any foods or liquids.  She  reports vomiting 3 times in the last 24 hours.   When the patient was hospitalized, there was concern that she may have  had H1N1 and H1N1 testing was performed, results of which returned  negative for H1N1 virus.  The patient also underwent a workup by  Infectious Diseases as well.  Please refer to the previous history and  physical and discharge summary from that hospitalization.   PAST MEDICAL HISTORY:  Significant for:  1. Bipolar disorder.  2. Asthma.  3. Restless leg syndrome.  4. Endometriosis.  5. Polycystic ovarian disease.  6. Obesity.   PAST SURGICAL HISTORY:  None.   MEDICATIONS:  1. Avelox therapy which was to continue for additional 6 days 400 mg      p.o. daily.  2. Lithium 300 mg p.o. q. a.m. 600 mg p.o. q.h.s.  3. Geodon 120 mg p.o. b.i.d.  4. Prozac 40 mg p.o. daily.  5. Klonopin 1 mg p.o. b.i.d..  6. Trazodone 100 mg p.o. q.h.s.  7. Tylenol p.r.n.  8. Imodium p.r.n.   The patient has allergies to DEMEROL,  TORADOL and non-steroidal anti-  inflammatory drugs.   SOCIAL HISTORY:  The patient is unemployed.  She is a smoker, smokes one  half-pack daily.  She is a nondrinker and denies illicit drug usage.   FAMILY HISTORY:  Positive for bipolar disorder in her mother.   REVIEW OF SYSTEMS:  Pertinents are mentioned above.   PHYSICAL EXAMINATION FINDINGS:  This is an obese 30 year old female in  discomfort, but no acute distress.  VITAL Signs: Temperature 98.5, blood pressure 126/77, heart rate 129  initially, now 110, respiratory rate 30, now 20.  O2 saturations 96 to  100% on 2 liters nasal cannula oxygen.  HEENT: Examination normocephalic, atraumatic.  There is no scleral  icterus.  Pupils equally round, reactive to light.  Extraocular  movements are intact.  Funduscopic benign.  Oropharynx is clear.  NECK:  Supple, full range of motion.  No thyromegaly, adenopathy or  jugular venous distention.  CARDIOVASCULAR:  Tachycardiac rate and rhythm.  No murmurs, gallops or  rubs.  LUNGS:  With decreased breath  sounds, coarse rhonchi present.  ABDOMEN: Positive bowel sounds, soft, nontender, nondistended.  EXTREMITIES: Without cyanosis, clubbing or edema.  NEUROLOGIC:  Examination nonfocal.   LABORATORY STUDIES:  White blood cell count 16.5, hemoglobin 12.6,  hematocrit 37.2, platelets 418, neutrophils 78%, lymphocytes 14%.  Sodium 135, potassium 3.5, chloride 103, carbon dioxide 23, BUN 5,  creatinine 0.88 and glucose 111.  Chest x-ray reveals possible pneumonia  in the right upper lobe.   ASSESSMENT:  A 30 year old female being admitted with:  1. Atypical pneumonia.  2. Asthma.  3. Nausea and vomiting.  4. Bipolar disorder.  5. Sinus tachycardia.   PLAN:  The patient will be admitted to an area for monitoring and will  be placed on supplemental oxygen and IV antibiotic therapy of Avelox  along with nebulizer treatments of albuterol and Atrovent.  IV fluids  have been ordered for fluid  resuscitation.  Her heart rate has improved  with administration of IV fluids.  The patient will continue on her  regular medications as well and antiemetic therapy has been ordered.  Deep vein thrombosis and gastrointestinal prophylaxis have also been  ordered.  Further workup will ensue pending the patient's clinical  course.      Della Goo, M.D.  Electronically Signed     HJ/MEDQ  D:  01/24/2008  T:  01/24/2008  Job:  [id number]   cc:   Dr. Lanier [place]

## 2010-08-31 NOTE — H&P (Signed)
[name], [name]       ACCOUNT NO.:  [id number]   MEDICAL RECORD NO.:  [id number]          PATIENT TYPE:  INP   LOCATION:  9373                          FACILITY:  [facility]   PHYSICIAN:  Juan H. Lily Peer, M.D.DATE OF BIRTH:  09-May-1980   DATE OF ADMISSION:  01/24/2006  DATE OF DISCHARGE:                                HISTORY & PHYSICAL   HISTORY:  The patient is a 30 year old gravida 0, who presented to [facility] last night stating that she had started complaining of acute  onset of low abdominal pain.  Patient with no history of endometriosis, had  been followed by gynecologist in [facility] area.  Stated that she had been  placed on Lupron monthly for 3 months in the past.  They tried to put her on  oral contraceptive pill but she could not tolerate it well.  The patient  yesterday started her menstrual period.   The patient has a history of bipolar disorder for which she is on the  following medications:  1. She is on Klonopin 1 mg p.o. b.i.d.  2. Neurontin 300 mg, two tablets t.i.d. and four tablets for bedtime.  3. Geodon 120 mg q.h.s.  4. She also takes Advair b.i.d. for her asthma.  5. Requip for restless leg syndrome.   She is allergic to:  1. DEMEROL.  2. TORADOL.   While in the emergency room at [facility], she had an ultrasound  and CT which demonstrated only a left ovarian cyst measuring 2.9-cm,  otherwise normal uterus and no __________  ovary with the exception of small  corpus luteum cyst on the right.  The patient's white blood cell count was  8.6.  Her hemoglobin was 12.5.  Urine pregnancy test was negative.  Urinalysis 3-6 red blood cells, 0-2 WBC.  GC, Chlamydia, and wet prep only a  few clue cells were reported.  The patient had received pain management in  the emergency room consisting of several dosing times of Dilaudid and she  was admitted for observation for pain management.  The patient smokes a half  a pack of  cigarettes daily.  She denies drug abuse or alcohol.  She has a  steady relationship right now.  Denies any prior history of STD.  The only  operation she has had was the laparoscopic procedure, where she was  diagnosed with endometriosis.  She is also seeing a psychiatrist in [facility] for bipolar disorder.  She states she was admitted last night for pain  management.  The patient today was somewhat groggy.  Her significant other  states that when she takes her Klonopin that sometimes she gets a little  sleepy but she was receiving Stadol 1 mg q.4 h. IV here for pain management  and she was somewhat sleepy and lethargic but was conversant asking  questions.   PHYSICAL EXAMINATION:  GENERAL:  A well-developed, well-nourished female  resting comfortably.  LUNGS:  Clear to auscultation without any rhonchi or wheezes.  HEART:  Regular rate and rhythm.  No murmurs or gallops.  ABDOMEN:  Soft.  Positive bowel sounds.  No rebound.  No guarding.  EXTREMITIES:  No cords.  No edema.   ASSESSMENT:  A 30 year old gravida 0, para 0 with low abdominal pain,  recently started yesterday with prior history of endometriosis.   The patient had been given Lupron 11.25 mg on admission due to her prior  history of endometriosis.  Symptomatology may be attributed to several  factors:  (1) flare up,  (2) the small hemorrhagic cyst and, (3) the  initiation of her menstrual period yesterday.  She had a normal hemoglobin  of 12.5 and a white blood count of 8.6, and her urine pregnancy test had  been negative.   We will taper off her Stadol and plan on discharging her home later today to  be followed up in the GYN clinic for further treatment and perhaps to work  with a pharmaceutical company to be able to provide for her next Lupron dose  of 11.25 mg which is due in 3 months and then afterwards try to consider an  alternative oral contraceptive pill to maintain suppression of her  __________  or at the  later date consider redo laparoscope.  I explained  this to the patient as well as to her significant other.  All questions were  answered and will follow accordingly.   We will discharge her home with Tylox 1 p.o. q.4-6h. p.r.n. pain but not to  take at the same time as she takes her Klonopin or Neurontin and, also to  take Motrin 800 mg t.i.d. in between so she would not have to use the  narcotics as often.      Juan H. Lily Peer, M.D.  Electronically Signed     JHF/MEDQ  D:  01/25/2006  T:  01/25/2006  Job:  [id number]

## 2011-01-07 LAB — URINALYSIS, ROUTINE W REFLEX MICROSCOPIC
Bilirubin Urine: NEGATIVE
Hgb urine dipstick: NEGATIVE
Ketones, ur: NEGATIVE
Specific Gravity, Urine: 1.036 — ABNORMAL HIGH
Urobilinogen, UA: 1

## 2011-01-07 LAB — CBC
HCT: 36.8
MCV: 96.6
Platelets: 296
RDW: 13.6

## 2011-01-07 LAB — BASIC METABOLIC PANEL
BUN: 16
Chloride: 102
GFR calc non Af Amer: >60
Glucose, Bld: 86
Potassium: 4

## 2011-01-07 LAB — DIFFERENTIAL
Basophils Absolute: 0
Eosinophils Absolute: 0.4
Eosinophils Relative: 5

## 2011-01-07 LAB — URINE MICROSCOPIC-ADD ON

## 2011-01-07 LAB — URINE CULTURE

## 2011-01-10 LAB — BASIC METABOLIC PANEL
CO2: 26
Calcium: 8.9
Chloride: 100
GFR calc Af Amer: >60
Sodium: 137

## 2011-01-10 LAB — DIFFERENTIAL
Basophils Relative: 0
Monocytes Absolute: 0.7
Monocytes Relative: 8
Neutro Abs: 5.2

## 2011-01-10 LAB — CBC
Hemoglobin: 13.2
MCHC: 35.7
MCV: 96.2
RBC: 3.83 — ABNORMAL LOW

## 2011-01-10 LAB — URINALYSIS, ROUTINE W REFLEX MICROSCOPIC
Ketones, ur: NEGATIVE
Nitrite: NEGATIVE
Protein, ur: 30 — AB
Urobilinogen, UA: 1

## 2011-01-14 LAB — BASIC METABOLIC PANEL
BUN: 4 — ABNORMAL LOW
BUN: 4 — ABNORMAL LOW
BUN: 6
BUN: 9
CO2: 21
CO2: 20
CO2: 23
CO2: 24
CO2: 25
Calcium: 8.7
Calcium: 9
Calcium: 9.3
Chloride: 107
Chloride: 103
Chloride: 105
Chloride: 109
Chloride: 110
Chloride: 111
Creatinine, Ser: 0.94
Creatinine, Ser: 1.07
GFR calc Af Amer: >60
GFR calc Af Amer: >60
GFR calc Af Amer: >60
GFR calc non Af Amer: >60
GFR calc non Af Amer: >60
GFR calc non Af Amer: >60
Glucose, Bld: 95
Glucose, Bld: 127 — ABNORMAL HIGH
Glucose, Bld: 200 — ABNORMAL HIGH
Glucose, Bld: 96
Potassium: 5
Potassium: 3.2 — ABNORMAL LOW
Potassium: 3.5
Potassium: 3.5
Sodium: 135
Sodium: 136
Sodium: 138
Sodium: 139
Sodium: 141

## 2011-01-14 LAB — CULTURE, RESPIRATORY W GRAM STAIN

## 2011-01-14 LAB — URINE MICROSCOPIC-ADD ON

## 2011-01-14 LAB — DIFFERENTIAL
Basophils Absolute: 0
Basophils Absolute: 0
Basophils Relative: 0
Basophils Relative: 0
Eosinophils Absolute: 0
Eosinophils Absolute: 0.2
Eosinophils Absolute: 0.7
Eosinophils Relative: 2
Eosinophils Relative: 4
Lymphocytes Relative: 14
Lymphocytes Relative: 22
Lymphs Abs: 2.2
Lymphs Abs: 2.4
Monocytes Absolute: 0.1
Monocytes Absolute: 0.8
Monocytes Relative: 0 — ABNORMAL LOW
Monocytes Relative: 4
Monocytes Relative: 8
Neutro Abs: 8 — ABNORMAL HIGH
Neutrophils Relative %: 51

## 2011-01-14 LAB — CULTURE, BLOOD (ROUTINE X 2)
Culture: NO GROWTH
Culture: NO GROWTH

## 2011-01-14 LAB — LEGIONELLA PROFILE(CULTURE+DFA/SMEAR): Legionella Antigen (DFA): NEGATIVE

## 2011-01-14 LAB — COMPREHENSIVE METABOLIC PANEL
ALT: 16
AST: 17
CO2: 21
Chloride: 105
GFR calc Af Amer: >60
GFR calc non Af Amer: >60
Potassium: 3.9
Sodium: 134 — ABNORMAL LOW
Total Bilirubin: <0.1 — ABNORMAL LOW

## 2011-01-14 LAB — LITHIUM LEVEL: Lithium Lvl: <0.25 — ABNORMAL LOW

## 2011-01-14 LAB — FUNGUS CULTURE W SMEAR

## 2011-01-14 LAB — GLUCOSE, CAPILLARY
Glucose-Capillary: 160 — ABNORMAL HIGH
Glucose-Capillary: 89

## 2011-01-14 LAB — CBC
HCT: 32.2 — ABNORMAL LOW
HCT: 32.7 — ABNORMAL LOW
HCT: 33.5 — ABNORMAL LOW
HCT: 34.9 — ABNORMAL LOW
HCT: 37.2
Hemoglobin: 11 — ABNORMAL LOW
Hemoglobin: 11.1 — ABNORMAL LOW
Hemoglobin: 11.2 — ABNORMAL LOW
Hemoglobin: 11.3 — ABNORMAL LOW
Hemoglobin: 13
MCHC: 34
MCHC: 34.2
MCHC: 35.1
MCV: 96.3
MCV: 98.3
MCV: 98.3
MCV: 98.5
MCV: 99.4
Platelets: 308
Platelets: 338
Platelets: 345
Platelets: 418 — ABNORMAL HIGH
RBC: 3.28 — ABNORMAL LOW
RBC: 3.31 — ABNORMAL LOW
RBC: 3.38 — ABNORMAL LOW
RBC: 3.38 — ABNORMAL LOW
RBC: 3.54 — ABNORMAL LOW
RBC: 3.86 — ABNORMAL LOW
RBC: 3.86 — ABNORMAL LOW
RDW: 14.1
RDW: 14.2
RDW: 14.3
RDW: 14.4
WBC: 11.2 — ABNORMAL HIGH
WBC: 11.9 — ABNORMAL HIGH
WBC: 13.1 — ABNORMAL HIGH
WBC: 16.5 — ABNORMAL HIGH
WBC: 8.9
WBC: 8.9

## 2011-01-14 LAB — C-REACTIVE PROTEIN: CRP: 2.1 — ABNORMAL HIGH (ref <0.6)

## 2011-01-14 LAB — URINALYSIS, ROUTINE W REFLEX MICROSCOPIC
Bilirubin Urine: NEGATIVE
Hgb urine dipstick: NEGATIVE
Specific Gravity, Urine: >1.046 — ABNORMAL HIGH
Urobilinogen, UA: 1

## 2011-01-14 LAB — AFB CULTURE WITH SMEAR (NOT AT ARMC): Acid Fast Smear: NONE SEEN

## 2011-01-14 LAB — URINE CULTURE: Culture: NO GROWTH

## 2011-01-14 LAB — SEDIMENTATION RATE
Sed Rate: 28 — ABNORMAL HIGH
Sed Rate: 28 — ABNORMAL HIGH

## 2011-01-14 LAB — C3 COMPLEMENT: C3 Complement: 115

## 2011-01-14 LAB — LEGIONELLA ANTIGEN, URINE

## 2011-01-14 LAB — C4 COMPLEMENT: Complement C4, Body Fluid: 18

## 2011-01-14 LAB — H1N1 SCREEN (PCR): H1N1 Virus Scrn: NOT DETECTED

## 2011-01-14 LAB — ANA: Anti Nuclear Antibody(ANA): NEGATIVE

## 2011-01-14 LAB — QUANTIFERON TB GOLD ASSAY (BLOOD): Interferon Gamma Release Assay: UNDETERMINED — AB

## 2011-01-14 LAB — TSH: TSH: 0.607

## 2011-01-14 LAB — MPO/PR-3 (ANCA) ANTIBODIES

## 2011-01-14 LAB — RHEUMATOID FACTOR: Rhuematoid fact SerPl-aCnc: <20

## 2011-01-15 LAB — URINALYSIS, ROUTINE W REFLEX MICROSCOPIC
Bilirubin Urine: NEGATIVE
Hgb urine dipstick: NEGATIVE
Ketones, ur: 15 — AB
Ketones, ur: NEGATIVE
Leukocytes, UA: NEGATIVE
Nitrite: NEGATIVE
Nitrite: NEGATIVE
Protein, ur: 100 — AB
Protein, ur: NEGATIVE
Urobilinogen, UA: 0.2
Urobilinogen, UA: 0.2
Urobilinogen, UA: 0.2

## 2011-01-15 LAB — CBC
HCT: 37.4
MCHC: 34.4
MCV: 98.7
Platelets: 326
RBC: 3.94
RDW: 14.2
RDW: 14.3

## 2011-01-15 LAB — BASIC METABOLIC PANEL
Calcium: 9.4
GFR calc Af Amer: >60
GFR calc non Af Amer: >60
Sodium: 142

## 2011-01-15 LAB — DIFFERENTIAL
Basophils Absolute: 0
Basophils Relative: 0
Lymphocytes Relative: 9 — ABNORMAL LOW
Monocytes Relative: 1 — ABNORMAL LOW
Neutro Abs: 7.9 — ABNORMAL HIGH
Neutrophils Relative %: 89 — ABNORMAL HIGH

## 2011-01-15 LAB — POCT I-STAT 3, ART BLOOD GAS (G3+)
TCO2: 24
pH, Arterial: 7.447 — ABNORMAL HIGH

## 2011-01-15 LAB — COMPREHENSIVE METABOLIC PANEL
Albumin: 3.7
BUN: 6
Calcium: 8.5
Creatinine, Ser: 0.97
Potassium: 3 — ABNORMAL LOW
Total Protein: 6.1

## 2011-01-15 LAB — POCT PREGNANCY, URINE: Preg Test, Ur: NEGATIVE

## 2011-01-15 LAB — SALICYLATE LEVEL: Salicylate Lvl: 1 — ABNORMAL LOW

## 2011-01-15 LAB — ACETAMINOPHEN LEVEL: Acetaminophen (Tylenol), Serum: <10 — ABNORMAL LOW

## 2011-01-15 LAB — GC/CHLAMYDIA PROBE AMP, GENITAL: Chlamydia, DNA Probe: NEGATIVE

## 2011-01-15 LAB — POCT TOXICOLOGY PANEL: Opiates: POSITIVE

## 2011-01-15 LAB — WET PREP, GENITAL
Clue Cells Wet Prep HPF POC: NONE SEEN
Yeast Wet Prep HPF POC: NONE SEEN

## 2011-01-16 LAB — COMPREHENSIVE METABOLIC PANEL
AST: 17
AST: 18
Albumin: 4.4
BUN: 9
CO2: 26
Calcium: 9.5
Chloride: 104
Creatinine, Ser: 1
Creatinine, Ser: 1.1
GFR calc Af Amer: >60
GFR calc non Af Amer: >60
Glucose, Bld: 86
Total Bilirubin: 0.3
Total Protein: 7.3

## 2011-01-16 LAB — URINALYSIS, ROUTINE W REFLEX MICROSCOPIC
Bilirubin Urine: NEGATIVE
Bilirubin Urine: NEGATIVE
Glucose, UA: NEGATIVE
Hgb urine dipstick: NEGATIVE
Hgb urine dipstick: NEGATIVE
Hgb urine dipstick: NEGATIVE
Ketones, ur: 15 — AB
Nitrite: NEGATIVE
Nitrite: POSITIVE — AB
Protein, ur: 30 — AB
Protein, ur: NEGATIVE
Specific Gravity, Urine: 1.016
Specific Gravity, Urine: 1.025
Urobilinogen, UA: 0.2
Urobilinogen, UA: 4 — ABNORMAL HIGH
pH: 5
pH: 7.5

## 2011-01-16 LAB — CBC
Hemoglobin: 13.6
MCHC: 34.9
MCHC: 35.7
MCV: 95.6
MCV: 95.9
Platelets: 325
RDW: 13.3
RDW: 13.6
WBC: 16.3 — ABNORMAL HIGH

## 2011-01-16 LAB — URINE MICROSCOPIC-ADD ON

## 2011-01-16 LAB — DIFFERENTIAL
Basophils Absolute: 0.1
Eosinophils Relative: 3
Eosinophils Relative: 3
Lymphocytes Relative: 14
Lymphocytes Relative: 22
Lymphs Abs: 2.2
Monocytes Absolute: 0.7
Neutro Abs: 7.5

## 2011-01-16 LAB — URINE CULTURE: Colony Count: >=100000

## 2011-01-16 LAB — LITHIUM LEVEL: Lithium Lvl: 0.74 — ABNORMAL LOW

## 2011-01-16 LAB — LIPASE, BLOOD: Lipase: 43

## 2011-01-18 LAB — POCT PREGNANCY, URINE: Preg Test, Ur: NEGATIVE

## 2011-01-18 LAB — URINALYSIS, ROUTINE W REFLEX MICROSCOPIC
Bilirubin Urine: NEGATIVE
Hgb urine dipstick: NEGATIVE
Ketones, ur: NEGATIVE mg/dL
Nitrite: NEGATIVE
Protein, ur: NEGATIVE mg/dL
Specific Gravity, Urine: 1.013 (ref 1.005–1.030)
Urobilinogen, UA: 0.2 mg/dL (ref 0.0–1.0)

## 2011-01-18 LAB — D-DIMER, QUANTITATIVE: D-Dimer, Quant: 0.27 ug/mL-FEU (ref 0.00–0.48)

## 2011-01-28 LAB — BASIC METABOLIC PANEL
CO2: 26
Calcium: 8.6
Calcium: 9.4
Creatinine, Ser: 0.95
GFR calc Af Amer: >60
GFR calc Af Amer: >60
GFR calc non Af Amer: >60
GFR calc non Af Amer: >60
Sodium: 135
Sodium: 136

## 2011-01-28 LAB — URINALYSIS, ROUTINE W REFLEX MICROSCOPIC
Bilirubin Urine: NEGATIVE
Bilirubin Urine: NEGATIVE
Bilirubin Urine: NEGATIVE
Glucose, UA: NEGATIVE
Glucose, UA: NEGATIVE
Hgb urine dipstick: NEGATIVE
Hgb urine dipstick: NEGATIVE
Ketones, ur: NEGATIVE
Ketones, ur: NEGATIVE
Nitrite: NEGATIVE
Nitrite: NEGATIVE
Nitrite: NEGATIVE
Protein, ur: NEGATIVE
Protein, ur: NEGATIVE
Specific Gravity, Urine: 1.023
Specific Gravity, Urine: 1.025
Urobilinogen, UA: 0.2
Urobilinogen, UA: 0.2
Urobilinogen, UA: 0.2
pH: 5.5
pH: 6

## 2011-01-28 LAB — POCT PREGNANCY, URINE
Operator id: 29011
Preg Test, Ur: NEGATIVE

## 2011-01-28 LAB — DIFFERENTIAL
Basophils Absolute: 0
Basophils Absolute: 0
Basophils Relative: 1
Lymphocytes Relative: 31
Lymphocytes Relative: 33
Monocytes Absolute: 0.5
Monocytes Absolute: 0.7
Monocytes Relative: 10
Monocytes Relative: 9
Neutro Abs: 2.7
Neutro Abs: 3.9
Neutrophils Relative %: 52

## 2011-01-28 LAB — WET PREP, GENITAL
Clue Cells Wet Prep HPF POC: NONE SEEN
Clue Cells Wet Prep HPF POC: NONE SEEN
Trich, Wet Prep: NONE SEEN
Trich, Wet Prep: NONE SEEN
WBC, Wet Prep HPF POC: NONE SEEN
WBC, Wet Prep HPF POC: NONE SEEN

## 2011-01-28 LAB — CBC
Hemoglobin: 11.9 — ABNORMAL LOW
Hemoglobin: 12.9
MCHC: 35.2
RBC: 3.41 — ABNORMAL LOW
RBC: 3.7 — ABNORMAL LOW
RDW: 13.1
RDW: 13.3

## 2011-01-28 LAB — PREGNANCY, URINE: Preg Test, Ur: NEGATIVE

## 2011-01-28 LAB — URINE MICROSCOPIC-ADD ON

## 2011-01-28 LAB — RPR: RPR Ser Ql: NONREACTIVE

## 2013-11-03 ENCOUNTER — Telehealth: Payer: Self-pay | Admitting: General Practice

## 2013-11-03 NOTE — Telephone Encounter (Signed)
Patient called and left message stating she has an appt on August 12 and has had a hysterectomy and history of endometriosis and is suffering from pelvic pain. Patient also reports she's had multiple ultrasounds done that show potential endometriosis in her ovaries now and has been back and forth to the ER and she cannot keep going there. States she is having a lot of pain now and is taking 6 tylenol a day that's not helping and she talked to her primary care doctor and they gave her Fioricet but it doesn't do anything and they told her to call us since we will be seeing her for this. Called patient back stating I am returning her phone call. Told patient that since we have not seen her in our office yet we cannot prescribed her pain medication and recommended she call her primary care doctor back or go to an urgent care. Patient verbalized understanding and stated that she doesn't know what to do because she cannot drive because of her epilepsy and has a hard time getting around. Told patient to see if a friend could drop her off at an urgent care at least. Patient verbalized understanding and had no other questions

## 2013-11-24 ENCOUNTER — Encounter: Payer: Self-pay | Admitting: Nurse Practitioner

## 2013-12-22 ENCOUNTER — Encounter: Payer: Self-pay | Admitting: *Deleted

## 2015-05-17 DEATH — deceased
